# Patient Record
Sex: Female | Born: 1995 | Race: Black or African American | Hispanic: No | Marital: Single | Smoking: Never smoker
Health system: Southern US, Community
[De-identification: ages and names within clinical notes are randomized; demographics above are authoritative.]

## PROBLEM LIST (undated history)

## (undated) DIAGNOSIS — F419 Anxiety disorder, unspecified: Secondary | ICD-10-CM

---

## 2019-02-06 ENCOUNTER — Other Ambulatory Visit: Payer: Self-pay

## 2019-02-06 ENCOUNTER — Encounter (HOSPITAL_BASED_OUTPATIENT_CLINIC_OR_DEPARTMENT_OTHER): Payer: Self-pay | Admitting: *Deleted

## 2019-02-06 ENCOUNTER — Emergency Department (HOSPITAL_BASED_OUTPATIENT_CLINIC_OR_DEPARTMENT_OTHER): Payer: No Typology Code available for payment source

## 2019-02-06 ENCOUNTER — Emergency Department (HOSPITAL_BASED_OUTPATIENT_CLINIC_OR_DEPARTMENT_OTHER)
Admission: EM | Admit: 2019-02-06 | Discharge: 2019-02-06 | Disposition: A | Discharge status: Home / Self Care | Payer: No Typology Code available for payment source | Attending: Emergency Medicine | Admitting: Emergency Medicine

## 2019-02-06 DIAGNOSIS — M542 Cervicalgia: Secondary | ICD-10-CM | POA: Diagnosis not present

## 2019-02-06 DIAGNOSIS — M545 Low back pain: Secondary | ICD-10-CM | POA: Diagnosis not present

## 2019-02-06 DIAGNOSIS — Y998 Other external cause status: Secondary | ICD-10-CM | POA: Insufficient documentation

## 2019-02-06 DIAGNOSIS — Y9389 Activity, other specified: Secondary | ICD-10-CM | POA: Diagnosis not present

## 2019-02-06 DIAGNOSIS — R519 Headache, unspecified: Secondary | ICD-10-CM | POA: Insufficient documentation

## 2019-02-06 DIAGNOSIS — Y92410 Unspecified street and highway as the place of occurrence of the external cause: Secondary | ICD-10-CM | POA: Diagnosis not present

## 2019-02-06 HISTORY — DX: Anxiety disorder, unspecified: F41.9

## 2019-02-06 LAB — PREGNANCY, URINE: Preg Test, Ur: NEGATIVE

## 2019-02-06 MED ORDER — CYCLOBENZAPRINE HCL 10 MG PO TABS: 10.0000 mg | ORAL_TABLET | Freq: Two times a day (BID) | ORAL | 0 refills | Status: AC | PRN

## 2019-02-06 MED ORDER — MELOXICAM 7.5 MG PO TABS: 7.5000 mg | ORAL_TABLET | Freq: Every day | ORAL | 0 refills | Status: AC

## 2019-02-06 MED ORDER — HYDROCODONE-ACETAMINOPHEN 5-325 MG PO TABS
1.0000 | ORAL_TABLET | Freq: Once | ORAL | Status: AC
  Administered 2019-02-06: 1 via ORAL
  Filled 2019-02-06: qty 1
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
## 2019-02-06 NOTE — ED Notes (Signed)
ED Provider at bedside. 
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
## 2019-02-06 NOTE — Discharge Instructions (Addendum)
You were given a prescription for Flexeril which is a muscle relaxer.  You should not drive, work, consume alcohol, or operate machinery while taking this medication as it can make you very drowsy.    Please take medications, as prescribed.  Please return the ED or seek medical attention should you develop any worsening headache or dizziness, visual deficits, numbness, tingling, weakness, incontinence, or other neurologic changes, uncontrolled nausea or vomiting, inability to ambulate, or any other new or worsening symptoms.

## 2019-02-06 NOTE — ED Notes (Signed)
Pt verbalized understanding of dc instructions.
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
## 2021-08-07 LAB — HM PAP SMEAR

## 2022-08-09 LAB — HM PAP SMEAR

## 2023-07-21 ENCOUNTER — Encounter: Payer: Self-pay | Admitting: Obstetrics and Gynecology

## 2023-07-21 ENCOUNTER — Ambulatory Visit: Admitting: Obstetrics and Gynecology

## 2023-07-21 VITALS — BP 129/85 | HR 106 | Ht 62.0 in | Wt 123.0 lb

## 2023-07-21 DIAGNOSIS — Z01419 Encounter for gynecological examination (general) (routine) without abnormal findings: Secondary | ICD-10-CM

## 2023-07-21 DIAGNOSIS — Z3202 Encounter for pregnancy test, result negative: Secondary | ICD-10-CM

## 2023-07-21 DIAGNOSIS — Z3045 Encounter for surveillance of transdermal patch hormonal contraceptive device: Secondary | ICD-10-CM

## 2023-07-21 DIAGNOSIS — N946 Dysmenorrhea, unspecified: Secondary | ICD-10-CM

## 2023-07-21 DIAGNOSIS — Z30431 Encounter for routine checking of intrauterine contraceptive device: Secondary | ICD-10-CM

## 2023-07-21 LAB — POCT URINE PREGNANCY: Preg Test, Ur: NEGATIVE

## 2023-07-21 MED ORDER — NORELGESTROMIN-ETH ESTRADIOL 150-35 MCG/24HR TD PTWK: 1.0000 | MEDICATED_PATCH | TRANSDERMAL | 15 refills | Status: AC

## 2023-07-21 MED ORDER — IBUPROFEN 800 MG PO TABS: 800.0000 mg | ORAL_TABLET | Freq: Three times a day (TID) | ORAL | 3 refills | Status: AC | PRN

## 2023-07-21 NOTE — Progress Notes (Unsigned)
 ANNUAL EXAM Patient name: Joanna Soto MRN [id number]  Date of birth: 1995-06-19 Chief Complaint:   No chief complaint on file.  History of Present Illness:   Joanna Soto is a 28 y.o. No obstetric history on file.  female being seen today for a routine annual exam.  Current complaints: here for annual exam. Doing well with birth control patch. Uses ibuprofen  for menstrual cramps. No other concerns, Is getting married soon. She reports having pap last year that was normal    Patient's last menstrual period was 06/28/2023 (approximate).   The pregnancy intention screening data noted above was reviewed. Potential methods of contraception were discussed. The patient elected to proceed with No data recorded.   Last pap 2024. Results were:Aaron Aas H/O abnormal pap:  Last mammogram: n/a. Results were: N/A. Family h/o breast cancer: maternal great aunt  Last colonoscopy: n/a. Results were: N/A. Family h/o colorectal cancer: no      No data to display               No data to display           Review of Systems:   Pertinent items are noted in HPI Denies any headaches, blurred vision, fatigue, shortness of breath, chest pain, abdominal pain, abnormal vaginal discharge/itching/odor/irritation, problems with periods, bowel movements, urination, or intercourse unless otherwise stated above. Pertinent History Reviewed:  Reviewed past medical,surgical, social and family history.  Reviewed problem list, medications and allergies. Physical Assessment:   Vitals:   07/21/23 1541  BP: 129/85  Pulse: (!) 106  Weight: 123 lb (55.8 kg)  Height: 5\' 2"  (1.575 m)  Body mass index is 22.5 kg/m.        Physical Examination:   General appearance - well appearing, and in no distress  Mental status - alert, oriented   Psych:  She has a normal mood and affect  Skin - warm and dry, normal color, no suspicious lesions noted  Chest - effort normal, all lung fields clear to auscultation  bilaterally  Heart - normal rate and regular rhythm  Neck:  midline trachea, no thyromegaly or nodules  Breasts - breasts appear normal, no suspicious masses, no skin or nipple changes or  axillary nodes  Abdomen - soft, nontender  Pelvic - declined  Extremities:  No swelling or varicosities noted  Chaperone present for exam  No results found for this or any previous visit (from the past 24 hours).  Assessment & Plan:  1. Encounter for annual routine gynecological examination (Primary) UPT on pap Continue birth control patch, refills today Encouraged routine breast exam Chart review after appt: reviewed pap results 08/07/21 [facility], pap 08/09/23  [facility]-  recommended colposcopy, do not see colposcopy. Will reach out to have her come for pap only   2. Surveillance for birth control, intrauterine device  - POCT urine pregnancy - ibuprofen  (ADVIL ) 800 MG tablet; Take 1 tablet (800 mg total) by mouth every 8 (eight) hours as needed.  Dispense: 60 tablet; Refill: 3 - norelgestromin -ethinyl estradiol  (XULANE) 150-35 MCG/24HR transdermal patch; Place 1 patch onto the skin once a week.  Dispense: 3 patch; Refill: 15  - POCT urine pregnancy  Labs/procedures today:   Mammogram: @ 27yo, or sooner if problems Colonoscopy: @ 27yo, or sooner if problems  Orders Placed This Encounter  Procedures   POCT urine pregnancy    Meds:  Meds ordered this encounter  Medications   ibuprofen  (ADVIL ) 800 MG tablet    Sig: Take 1 tablet (  800 mg total) by mouth every 8 (eight) hours as needed.    Dispense:  60 tablet    Refill:  3   norelgestromin -ethinyl estradiol  (XULANE) 150-35 MCG/24HR transdermal patch    Sig: Place 1 patch onto the skin once a week.    Dispense:  3 patch    Refill:  15    Follow-up: Return in about 1 year (around 07/20/2024) for Zoanne Hinders, FNP ``

## 2023-07-21 NOTE — Progress Notes (Signed)
 Needs refill on birth control patches. Asking for ibuprofen  as well.  Last PAP May of 2024, normal.   No other concerns at this time. Denies STD testing.

## 2023-07-25 ENCOUNTER — Telehealth: Payer: Self-pay

## 2023-07-25 NOTE — Telephone Encounter (Signed)
 TC to pt for [employer]. Trying to find out if pt had colpo done last year after abnormal pap. If not pt needs a pap only visit scheduled per [facility]. Pt did not answer, left VM with callback.

## 2023-07-27 ENCOUNTER — Telehealth: Payer: Self-pay

## 2023-07-27 NOTE — Telephone Encounter (Signed)
 TC to pt about pap only visit with [facility]. Pt did not have colposcopy performed last year due to insurance stating they would not pay. Pt is going to come in for pap only visit 5/21.

## 2023-08-17 ENCOUNTER — Ambulatory Visit: Admitting: Obstetrics and Gynecology

## 2023-08-17 ENCOUNTER — Other Ambulatory Visit (HOSPITAL_COMMUNITY)
Admission: RE | Admit: 2023-08-17 | Discharge: 2023-08-17 | Disposition: A | Discharge status: Home / Self Care | Source: Ambulatory Visit | Attending: Obstetrics and Gynecology | Admitting: Obstetrics and Gynecology

## 2023-08-17 ENCOUNTER — Encounter: Payer: Self-pay | Admitting: Obstetrics and Gynecology

## 2023-08-17 VITALS — BP 119/73 | HR 94 | Ht 62.0 in | Wt 121.4 lb

## 2023-08-17 DIAGNOSIS — Z01419 Encounter for gynecological examination (general) (routine) without abnormal findings: Secondary | ICD-10-CM | POA: Insufficient documentation

## 2023-08-17 DIAGNOSIS — Z124 Encounter for screening for malignant neoplasm of cervix: Secondary | ICD-10-CM

## 2023-08-17 NOTE — Progress Notes (Signed)
 Here for PAP. No other concerns.

## 2023-08-18 NOTE — Progress Notes (Signed)
   GYNECOLOGY PROGRESS NOTE  History:  28 y.o. No obstetric history on file. presents to [facility] for pap only.   Reviewed records from previous clinic. 08/07/2021 pap [facility] 08/09/2022 pap [facility] Has not had colposcopy, d/t insurance   Has had HPV vaccine   Health Maintenance Due  Topic Date Due   HIV Screening  Never done   Hepatitis C Screening  Never done   DTaP/Tdap/Td (1 - Tdap) Never done   COVID-19 Vaccine (1 - 2024-25 season) 11/28/2022     Review of Systems:  Pertinent items are noted in HPI.   Objective:  Physical Exam Blood pressure 119/73, pulse 94, height 5\' 2"  (1.575 m), weight 121 lb 6.4 oz (55.1 kg), last menstrual period 07/28/2023. VS reviewed, nursing note reviewed,  Constitutional: well developed, well nourished, no distress HEENT: normocephalic Pulm/chest wall: normal effort Breast Exam: deferred Psych: affect normal Pelvic exam: Pelvic: normal appearing vulva with no masses, tenderness or lesions  VAGINA: normal appearing vagina with normal color and discharge, no lesions  CERVIX: normal appearing cervix without discharge or lesions, no CMT  Thin prep pap is done   UTERUS: uterus is felt to be normal size, shape, consistency and nontender   ADNEXA: No adnexal masses or tenderness noted.   Assessment & Plan:  1. Encounter for annual routine gynecological examination (Primary) Repeat pap today, will determine follow up with results  - Cytology - PAP( [place])   Return in one year for annual  Susi Eric, FNP 1:17 PM

## 2023-08-29 LAB — CYTOLOGY - PAP
Comment: NEGATIVE
Diagnosis: UNDETERMINED — AB
High risk HPV: POSITIVE — AB

## 2023-08-31 ENCOUNTER — Encounter: Payer: Self-pay | Admitting: Obstetrics and Gynecology

## 2023-08-31 ENCOUNTER — Ambulatory Visit: Payer: Self-pay | Admitting: Obstetrics and Gynecology

## 2023-08-31 DIAGNOSIS — R87619 Unspecified abnormal cytological findings in specimens from cervix uteri: Secondary | ICD-10-CM | POA: Insufficient documentation
